# Patient Record
Sex: Female | Born: 1960 | Race: White | Hispanic: No | Marital: Married | Smoking: Never smoker
Health system: Southern US, Community
[De-identification: ages and names within clinical notes are randomized; demographics above are authoritative.]

## PROBLEM LIST (undated history)

## (undated) DIAGNOSIS — B019 Varicella without complication: Secondary | ICD-10-CM

## (undated) DIAGNOSIS — F419 Anxiety disorder, unspecified: Secondary | ICD-10-CM

## (undated) DIAGNOSIS — A6000 Herpesviral infection of urogenital system, unspecified: Secondary | ICD-10-CM

## (undated) HISTORY — PX: WISDOM TOOTH EXTRACTION: SHX21

## (undated) HISTORY — DX: Anxiety disorder, unspecified: F41.9

## (undated) HISTORY — PX: TUBAL LIGATION: SHX77

## (undated) HISTORY — DX: Varicella without complication: B01.9

## (undated) HISTORY — DX: Herpesviral infection of urogenital system, unspecified: A60.00

## (undated) HISTORY — PX: THERAPEUTIC ABORTION: SHX798

## (undated) HISTORY — PX: BACK SURGERY: SHX140

---

## 2005-02-17 ENCOUNTER — Ambulatory Visit: Payer: Self-pay

## 2006-06-15 ENCOUNTER — Other Ambulatory Visit: Payer: Self-pay

## 2006-06-15 ENCOUNTER — Observation Stay: Payer: Self-pay | Admitting: Internal Medicine

## 2006-08-03 ENCOUNTER — Ambulatory Visit: Payer: Self-pay

## 2007-11-18 ENCOUNTER — Ambulatory Visit: Payer: Self-pay | Admitting: Family Medicine

## 2008-11-20 ENCOUNTER — Ambulatory Visit: Payer: Self-pay | Admitting: Family Medicine

## 2009-12-03 ENCOUNTER — Ambulatory Visit: Payer: Self-pay

## 2011-11-10 LAB — HM COLONOSCOPY: HM COLON: NORMAL

## 2012-04-04 ENCOUNTER — Ambulatory Visit: Payer: Self-pay | Admitting: Family Medicine

## 2012-05-13 ENCOUNTER — Ambulatory Visit: Payer: Self-pay | Admitting: Unknown Physician Specialty

## 2013-06-27 ENCOUNTER — Ambulatory Visit: Payer: Self-pay | Admitting: Family Medicine

## 2013-07-04 LAB — HM PAP SMEAR: HM PAP: NORMAL

## 2013-10-25 LAB — HM MAMMOGRAPHY: HM Mammogram: NORMAL

## 2014-01-04 IMAGING — MG MM CAD SCREENING MAMMO
1 series · 5 of 5 positions shown · non-contrast
Comparison: none

REASON FOR EXAM: scr mammo no order
COMMENTS:

[R CC · right · 5 of 5 slices shown]
[im 1/5]
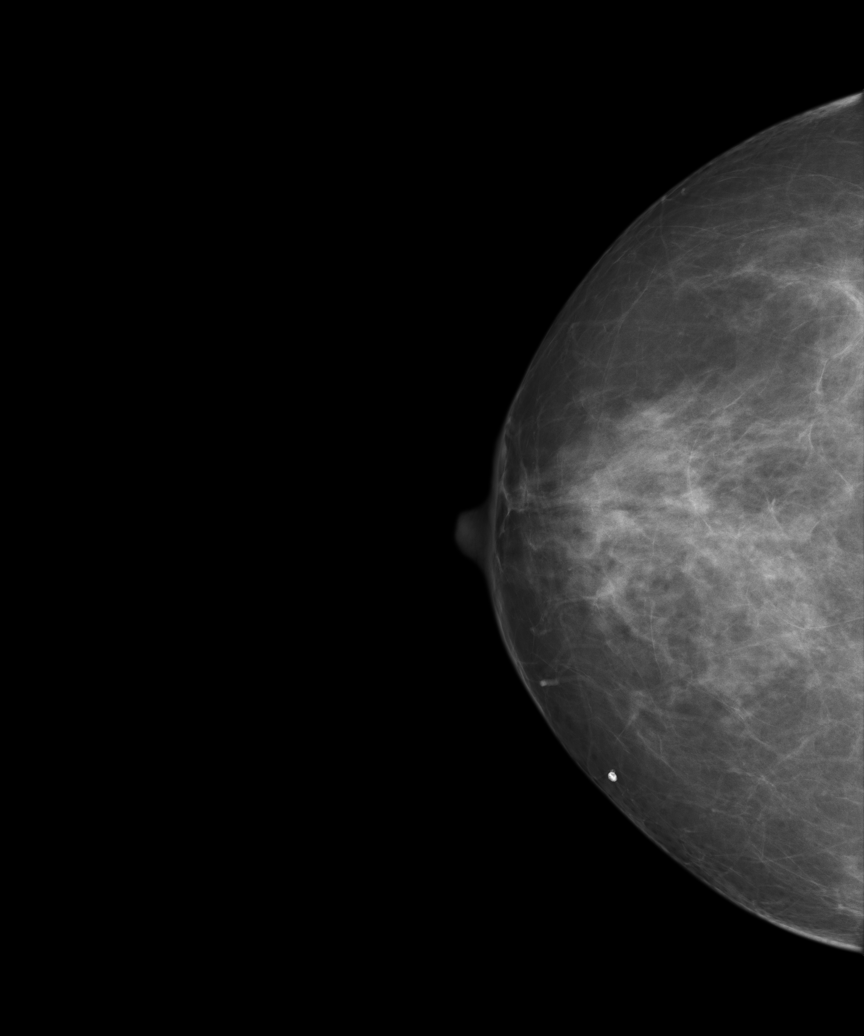
[im 2/5]
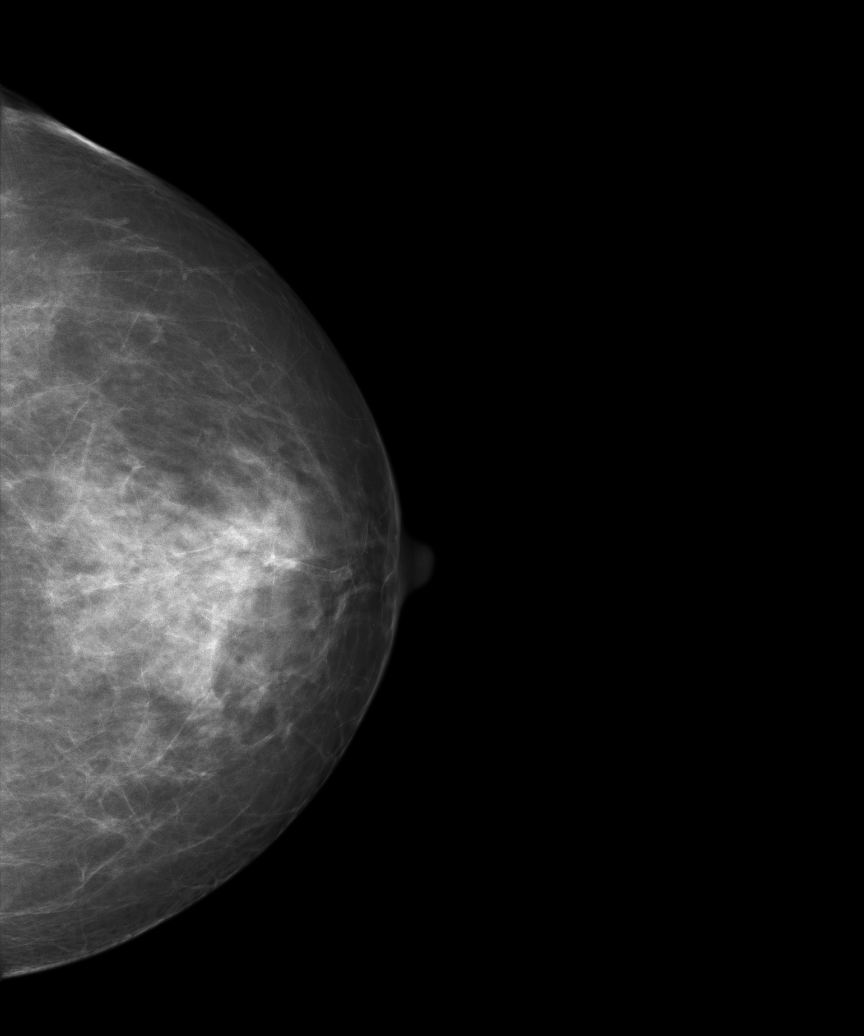
[im 3/5]
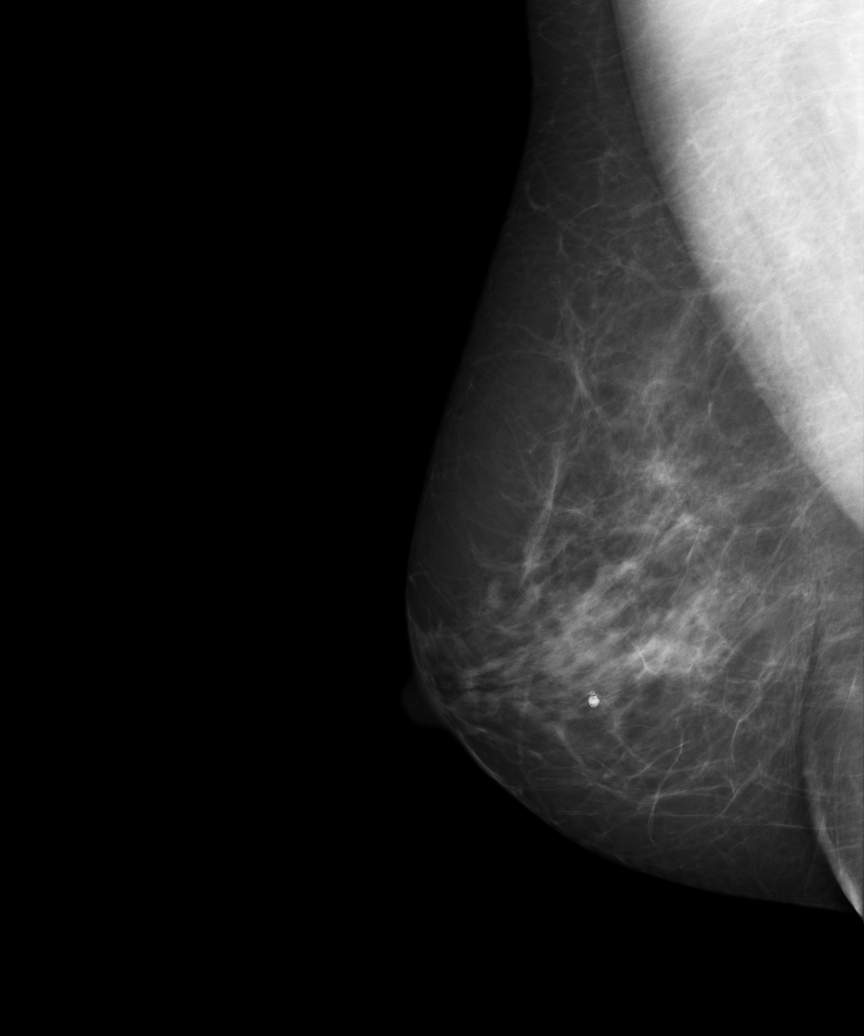
[im 4/5]
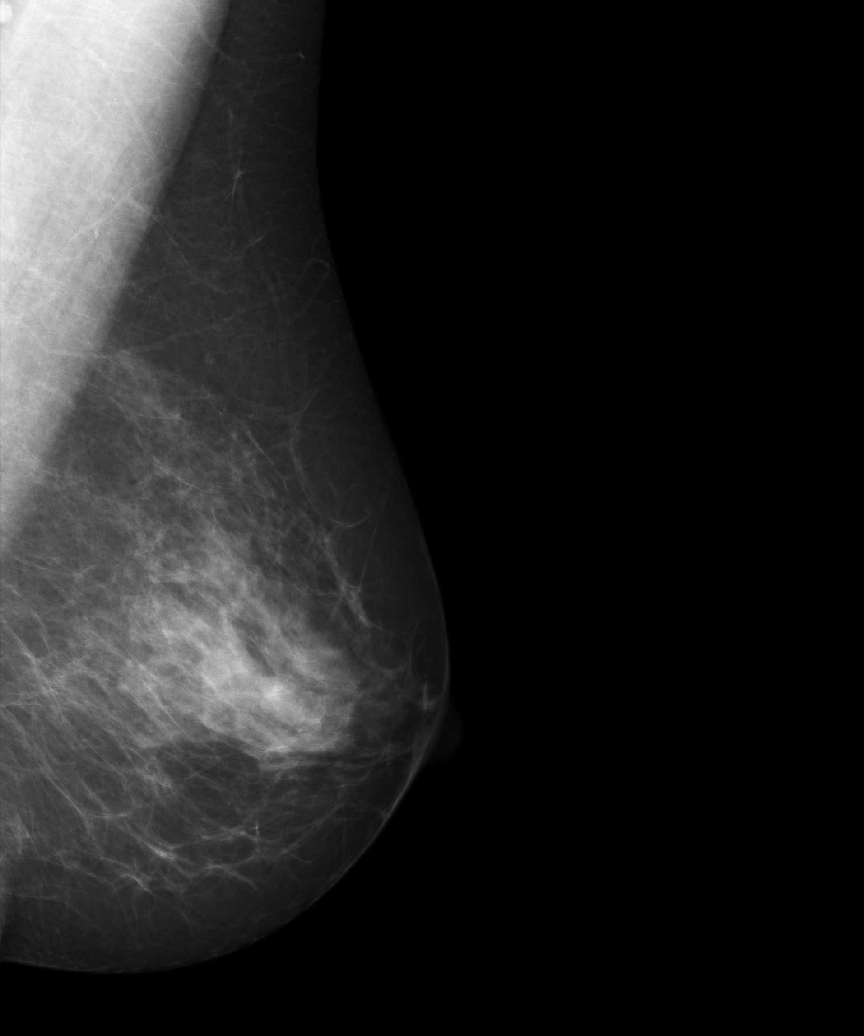
[im 5/5]
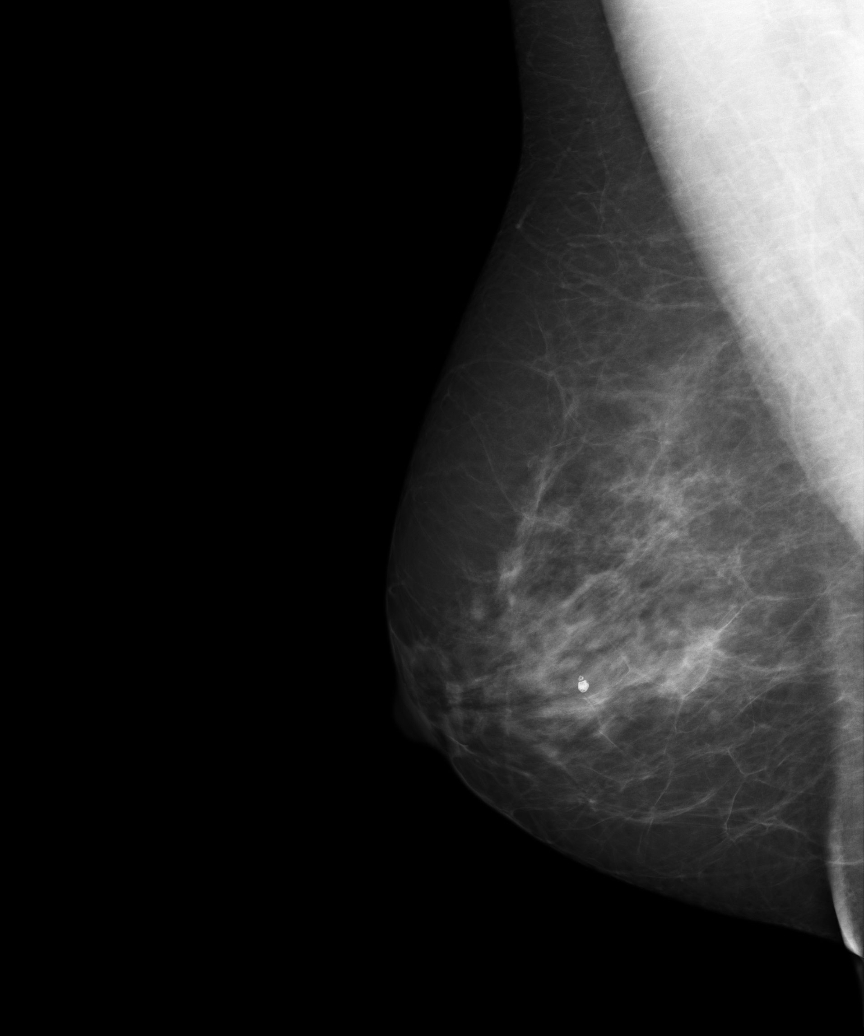

[5 of 5 positions shown; findings below may reference images not displayed]

PROCEDURE:     MAM - MAM DGTL SCRN MAM NO ORDER W/CAD  - April 04, 2012  [DATE]

RESULT:     Comparison is made to previous digital studies December 03, 2009,November 20, 2008, and June 27, 2002.

The breasts exhibit a heterogeneously dense parenchymal pattern. There is
evidence of some ongoing involution. A coarse macrocalcification is noted
medially on the right and is stable. There are no malignant appearing
groupings of microcalcification and there are no areas of new architectural
distortion.
IMPRESSION: There are no finding suspicious for malignancy.

BI-RADS 2: benign findings.

Recommendation: please continue to encourage yearly mammographic followup.

A NEGATIVE MAMMOGRAM REPORT DOES NOT PRECLUDE BIOPSY OR OTHER EVALUATION OF
A CLINICALLY PALPABLE OR OTHERWISE SUSPICIOUS MASS OR LESION. BREAST CANCER
MAY NOT BE DETECTED BY MAMMOGRAPHY IN UP TO 10% OF CASES.

Dictation site:1

## 2014-01-24 LAB — BASIC METABOLIC PANEL
BUN: 17 mg/dL (ref 4–21)
Creatinine: 0.8 mg/dL (ref 0.5–1.1)
Glucose: 93 mg/dL
Sodium: 139 mmol/L (ref 137–147)

## 2014-01-24 LAB — TSH: TSH: 1.16 u[IU]/mL (ref 0.41–5.90)

## 2014-01-24 LAB — HEPATIC FUNCTION PANEL: Bilirubin, Total: 0.7 mg/dL

## 2014-11-16 ENCOUNTER — Encounter (INDEPENDENT_AMBULATORY_CARE_PROVIDER_SITE_OTHER): Payer: Self-pay

## 2014-11-16 ENCOUNTER — Ambulatory Visit (INDEPENDENT_AMBULATORY_CARE_PROVIDER_SITE_OTHER): Payer: Self-pay | Admitting: Nurse Practitioner

## 2014-11-16 ENCOUNTER — Encounter: Payer: Self-pay | Admitting: Nurse Practitioner

## 2014-11-16 VITALS — BP 118/70 | HR 74 | Temp 97.7°F | Resp 12 | Ht 66.5 in | Wt 202.0 lb

## 2014-11-16 DIAGNOSIS — Z13220 Encounter for screening for lipoid disorders: Secondary | ICD-10-CM

## 2014-11-16 DIAGNOSIS — Z7189 Other specified counseling: Secondary | ICD-10-CM

## 2014-11-16 DIAGNOSIS — Z130 Encounter for screening for diseases of the blood and blood-forming organs and certain disorders involving the immune mechanism: Secondary | ICD-10-CM

## 2014-11-16 DIAGNOSIS — Z7689 Persons encountering health services in other specified circumstances: Secondary | ICD-10-CM

## 2014-11-16 DIAGNOSIS — F411 Generalized anxiety disorder: Secondary | ICD-10-CM

## 2014-11-16 DIAGNOSIS — Z131 Encounter for screening for diabetes mellitus: Secondary | ICD-10-CM

## 2014-11-16 DIAGNOSIS — B009 Herpesviral infection, unspecified: Secondary | ICD-10-CM

## 2014-11-16 DIAGNOSIS — Z1329 Encounter for screening for other suspected endocrine disorder: Secondary | ICD-10-CM

## 2014-11-16 DIAGNOSIS — Z1239 Encounter for other screening for malignant neoplasm of breast: Secondary | ICD-10-CM

## 2014-11-16 LAB — CBC WITH DIFFERENTIAL/PLATELET
Basophils Absolute: 0 10*3/uL (ref 0.0–0.1)
Basophils Relative: 0.7 % (ref 0.0–3.0)
Eosinophils Absolute: 0.1 10*3/uL (ref 0.0–0.7)
Eosinophils Relative: 2.7 % (ref 0.0–5.0)
HEMATOCRIT: 40.7 % (ref 36.0–46.0)
Hemoglobin: 13.9 g/dL (ref 12.0–15.0)
LYMPHS ABS: 2.2 10*3/uL (ref 0.7–4.0)
Lymphocytes Relative: 42.3 % (ref 12.0–46.0)
MCHC: 34.1 g/dL (ref 30.0–36.0)
MCV: 86.7 fl (ref 78.0–100.0)
MONOS PCT: 7.9 % (ref 3.0–12.0)
Monocytes Absolute: 0.4 10*3/uL (ref 0.1–1.0)
NEUTROS ABS: 2.4 10*3/uL (ref 1.4–7.7)
Neutrophils Relative %: 46.4 % (ref 43.0–77.0)
Platelets: 239 10*3/uL (ref 150.0–400.0)
RBC: 4.7 Mil/uL (ref 3.87–5.11)
RDW: 13.7 % (ref 11.5–15.5)
WBC: 5.2 10*3/uL (ref 4.0–10.5)

## 2014-11-16 LAB — COMPREHENSIVE METABOLIC PANEL
ALBUMIN: 4.2 g/dL (ref 3.5–5.2)
ALT: 19 U/L (ref 0–35)
AST: 22 U/L (ref 0–37)
Alkaline Phosphatase: 94 U/L (ref 39–117)
BUN: 17 mg/dL (ref 6–23)
CHLORIDE: 102 meq/L (ref 96–112)
CO2: 29 mEq/L (ref 19–32)
Calcium: 9.5 mg/dL (ref 8.4–10.5)
Creatinine, Ser: 0.78 mg/dL (ref 0.40–1.20)
GFR: 81.91 mL/min (ref >60.00)
GLUCOSE: 90 mg/dL (ref 70–99)
Potassium: 3.8 mEq/L (ref 3.5–5.1)
Sodium: 136 mEq/L (ref 135–145)
TOTAL PROTEIN: 7 g/dL (ref 6.0–8.3)
Total Bilirubin: 1 mg/dL (ref 0.2–1.2)

## 2014-11-16 LAB — LIPID PANEL
CHOL/HDL RATIO: 4
Cholesterol: 219 mg/dL — ABNORMAL HIGH (ref 0–200)
HDL: 62 mg/dL (ref >39.00)
LDL Cholesterol: 144 mg/dL — ABNORMAL HIGH (ref 0–99)
NonHDL: 157
Triglycerides: 66 mg/dL (ref 0.0–149.0)
VLDL: 13.2 mg/dL (ref 0.0–40.0)

## 2014-11-16 LAB — HEMOGLOBIN A1C: Hgb A1c MFr Bld: 5.5 % (ref 4.6–6.5)

## 2014-11-16 LAB — TSH: TSH: 0.88 u[IU]/mL (ref 0.35–4.50)

## 2014-11-16 MED ORDER — VALACYCLOVIR HCL 500 MG PO TABS: 500.0000 mg | ORAL_TABLET | Freq: Two times a day (BID) | ORAL | Status: DC

## 2014-11-16 MED ORDER — ESCITALOPRAM OXALATE 20 MG PO TABS: 20.0000 mg | ORAL_TABLET | Freq: Every day | ORAL | Status: DC

## 2014-11-16 MED ORDER — ALPRAZOLAM 0.5 MG PO TABS: 0.5000 mg | ORAL_TABLET | Freq: Every evening | ORAL | Status: DC | PRN

## 2014-11-16 NOTE — Progress Notes (Signed)
Pre visit review using our clinic review tool, if applicable. No additional management support is needed unless otherwise documented below in the visit note. 

## 2014-11-16 NOTE — Patient Instructions (Addendum)
Lexapro 20 mg daily was sent to the pharmacy.   Follow up in 6 weeks to see how it is working.   Mammogram- [facility] be [facility] and schedule in a week or so.   Please visit the lab before leaving today.  Welcome to [facility]!

## 2014-11-16 NOTE — Assessment & Plan Note (Addendum)
Discussed acute and chronic issues. Reviewed health maintenance measures, PFSHx, and immunizations. Obtain routine labs TSH, Lipid panel, CBC w/ diff, A1c, and CMET.   Mammogram referral placed.

## 2014-11-16 NOTE — Assessment & Plan Note (Signed)
Valtrex refilled e-Rx. 1-2 outbreaks a year. Will follow.

## 2014-11-16 NOTE — Assessment & Plan Note (Signed)
Uncontrolled. Switching from Paxil to Lexapro. Xanax script with 2 refills given. CSC and UDS done today. FU in 6 weeks.

## 2014-11-16 NOTE — Progress Notes (Signed)
Subjective:    Patient ID: Ashley Walton, female    DOB: [birth year], 54 y.o.   MRN: [id number]  HPI  Ashley Walton is a 54 yo female establishing care today.   1) New pt info:   Diet- Eats at home mostly   Exercise- Walks 7 days a week for 20 min.   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   Labs- In need of basic screening labs she reports  Review of Systems  Constitutional: Negative for fever, chills, diaphoresis and fatigue.  HENT: Negative for tinnitus and trouble swallowing.   Eyes: Negative for visual disturbance.  Respiratory: Negative for chest tightness, shortness of breath and wheezing.   Cardiovascular: Negative for chest pain, palpitations and leg swelling.  Gastrointestinal: Negative for nausea, vomiting, diarrhea and constipation.  Genitourinary: Negative for difficulty urinating.  Musculoskeletal: Positive for back pain. Negative for neck pain.  Skin: Negative for rash.  Neurological: Negative for dizziness, weakness, numbness and headaches.  Hematological: Does not bruise/bleed easily.  Psychiatric/Behavioral: Negative for suicidal ideas and sleep disturbance. The patient is nervous/anxious.    Past Medical History  Diagnosis Date  . Chicken pox   . Genital herpes   . UTI (lower urinary tract infection)     History   Social History  . Marital Status: Married    Spouse Name: N/A  . Number of Children: N/A  . Years of Education: N/A   Occupational History  . Not on file.   Social History Main  Topics  . Smoking status: Never Smoker   . Smokeless tobacco: Not on file  . Alcohol Use: 0.6 oz/week    1 Standard drinks or equivalent per week     Comment: Social drinker  . Drug Use: No  . Sexual Activity: No   Other Topics Concern  . 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 Back surgery    . Tubal ligation      Family History  Problem Relation Age of Onset  . Hypertension Mother   . Hypertension Sister   . Heart disease Maternal Grandmother   . Heart disease Maternal Grandfather   . Heart disease Paternal Grandfather     Allergies  Allergen Reactions  . Penicillins Hives    No current outpatient prescriptions on file prior to visit.   No current facility-administered medications on file prior to visit.       Objective:   Physical Exam  Constitutional: She is oriented to person, place, and time. She appears well-developed and well-nourished. No distress.  BP 118/70 mmHg  Pulse 74  Temp(Src) 97.7 F (36.5 C) (Oral)  Resp 12  Wt 202 lb (91.627 kg)  SpO2 97%   HENT:  Head: Normocephalic and atraumatic.  Right Ear: External ear normal.  Left Ear: External ear normal.  Eyes: EOM are normal. Pupils are equal, round, and reactive to light. Right eye exhibits no discharge. Left eye exhibits no discharge. No scleral icterus.  Neck: Normal range of motion. Neck supple. No thyromegaly present.  Cardiovascular: Normal rate, regular rhythm and normal heart sounds.  Exam reveals no gallop and no friction rub.   No murmur heard. Pulmonary/Chest: Effort normal and breath sounds normal. No respiratory distress. She has no wheezes. She has no rales. She exhibits no  tenderness.  Lymphadenopathy:    She has no cervical adenopathy.  Neurological: She is alert and oriented to person, place, and time. No cranial nerve deficit. She exhibits normal muscle tone. Coordination normal.  Skin: Skin is warm and dry. No rash noted. She is not diaphoretic.  Psychiatric: She has a normal mood and affect. Her behavior is normal. Judgment and thought content normal.      Assessment & Plan:

## 2014-12-28 ENCOUNTER — Telehealth: Payer: Self-pay

## 2014-12-28 ENCOUNTER — Ambulatory Visit: Payer: Self-pay | Admitting: Nurse Practitioner

## 2014-12-28 DIAGNOSIS — Z0289 Encounter for other administrative examinations: Secondary | ICD-10-CM

## 2014-12-28 NOTE — Telephone Encounter (Signed)
Patient to no show for 8am appointment. Called and left message to make another follow up appointment as soon as possible.

## 2014-12-31 NOTE — Telephone Encounter (Signed)
Created in error. See other note.

## 2015-01-20 ENCOUNTER — Other Ambulatory Visit: Payer: Self-pay | Admitting: Nurse Practitioner

## 2017-08-06 ENCOUNTER — Encounter: Payer: Self-pay | Admitting: Internal Medicine

## 2017-08-06 ENCOUNTER — Ambulatory Visit: Payer: BLUE CROSS/BLUE SHIELD | Admitting: Internal Medicine

## 2017-08-06 ENCOUNTER — Encounter (INDEPENDENT_AMBULATORY_CARE_PROVIDER_SITE_OTHER): Payer: Self-pay

## 2017-08-06 VITALS — BP 124/70 | HR 88 | Temp 97.8°F | Ht 65.5 in | Wt 172.5 lb

## 2017-08-06 DIAGNOSIS — B009 Herpesviral infection, unspecified: Secondary | ICD-10-CM | POA: Diagnosis not present

## 2017-08-06 DIAGNOSIS — F411 Generalized anxiety disorder: Secondary | ICD-10-CM

## 2017-08-06 DIAGNOSIS — R300 Dysuria: Secondary | ICD-10-CM | POA: Diagnosis not present

## 2017-08-06 DIAGNOSIS — R350 Frequency of micturition: Secondary | ICD-10-CM

## 2017-08-06 LAB — POC URINALSYSI DIPSTICK (AUTOMATED)
Bilirubin, UA: NEGATIVE
Blood, UA: POSITIVE
Glucose, UA: NEGATIVE
KETONES UA: NEGATIVE
LEUKOCYTES UA: NEGATIVE
Nitrite, UA: NEGATIVE
PH UA: 6 (ref 5.0–8.0)
PROTEIN UA: NEGATIVE
Spec Grav, UA: 1.01 (ref 1.010–1.025)
UROBILINOGEN UA: 0.2 U/dL

## 2017-08-06 MED ORDER — VALACYCLOVIR HCL 500 MG PO TABS: 500.0000 mg | ORAL_TABLET | Freq: Two times a day (BID) | ORAL | 1 refills | Status: AC

## 2017-08-06 MED ORDER — CIPROFLOXACIN HCL 500 MG PO TABS
500.0000 mg | ORAL_TABLET | Freq: Two times a day (BID) | ORAL | 0 refills | Status: DC
Start: 2017-08-06 — End: 2017-09-03

## 2017-08-06 NOTE — Patient Instructions (Signed)

## 2017-08-06 NOTE — Assessment & Plan Note (Signed)
Currently not an issue Will monitor 

## 2017-08-06 NOTE — Assessment & Plan Note (Signed)
Only 2 outbreaks per year Valtrex refilled today

## 2017-08-06 NOTE — Progress Notes (Signed)
HPI  Pt presents to the clinic today to establish care and for management of the conditions listed below. She is transferring care from Naomie Dean, NP.  Anxiety: Triggered by work stress but improved after she changed jobs. She was on Lexapro and Xanax at one time, but has been off for several years.  Genital Herpes: She takes Valtrex as needed. She does need a refill today.  She also c/o urinary frequency and dysuria. She reports this started about 3 weeks ago. She denies fever, chills, nausea or low back pain. She denies vaginal complaints. [facility] the week of [facility]. She was treated with 5 days of Macrobid but she reports the culture did not grow any bacteria. She has not had any improvement since that time. She saw a urologist about 8-9 years ago.   History of Lumbar Fusion: She c/o intermittent low back pain but nothing significant. She goes to the chiropractor monthly.   Flu: 06/2017 Tetanus: 07/2012 Pap Smear: 06/2013 Mammogram: 10/2013 Colon Screening: 10/2011 Vision Screening: annually Dentist: biannually  Past Medical History:  Diagnosis Date  . Anxiety   . Chicken pox   . Genital herpes   . Hyperlipidemia   . UTI (lower urinary tract infection)   . UTI (urinary tract infection)     Current Outpatient Medications  Medication Sig Dispense Refill  . valACYclovir (VALTREX) 500 MG tablet Take 1 tablet (500 mg total) by mouth 2 (two) times daily. 20 tablet 1   No current facility-administered medications for this visit.     Allergies  Allergen Reactions  . Penicillins Hives    Family History  Problem Relation Age of Onset  . Hypertension Mother   . Hypertension Father   . Hypertension Sister   . Heart disease Maternal Grandmother   . Heart disease Maternal Grandfather   . Heart disease Paternal Grandfather     Social History   Socioeconomic History  . Marital status: Married    Spouse name: Not on file  . Number of children: Not on  file  . Years of education: Not on file  . Highest education level: Not on file  Social Needs  . Financial resource strain: Not on file  . Food insecurity - worry: Not on file  . Food insecurity - inability: Not on file  . Transportation needs - medical: Not on file  . Transportation needs - non-medical: Not on file  Occupational History  . Not on file  Tobacco Use  . Smoking status: Never Smoker  . Smokeless tobacco: Never Used  Substance and Sexual Activity  . Alcohol use: Yes    Alcohol/week: 0.6 oz    Types: 1 Standard drinks or equivalent per week    Comment: Social drinker  . Drug use: No  . Sexual activity: Yes  Other Topics Concern  . 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  HEENT: Denies eye pain, eye redness, ear pain, ringing in the ears, wax buildup, runny nose, nasal congestion, bloody nose, or sore throat. Respiratory: Denies difficulty breathing, shortness of  breath, cough or sputum production.   Cardiovascular: Denies chest pain, chest tightness, palpitations or swelling in the hands or feet.  Gastrointestinal: Denies abdominal pain, bloating, constipation, diarrhea or blood in the stool.  GU: Pt reports urinary frequency and dysuria. Denies  urgency, blood in urine, odor or discharge. Musculoskeletal: Pt reports intermittent low back pain. Denies decrease in range of motion, difficulty with gait, muscle pain or joint swelling.  Skin: Denies redness, rashes, lesions or ulcercations.  Neurological: Denies dizziness, difficulty with memory, difficulty with speech or problems with balance and coordination.  Psych: Denies anxiety,  depression, SI/HI.  No other specific complaints in a complete review of systems (except as listed in HPI above).  PE:  BP 124/70   Pulse 88   Temp 97.8 F (36.6 C) (Oral)   Ht 5' 5.5" (1.664 m)   Wt 172 lb 8 oz (78.2 kg)   SpO2 98%   BMI 28.27 kg/m   Wt Readings from Last 3 Encounters:  11/16/14 202 lb (91.6 kg)    General: Appears her stated age, well developed, well nourished in NAD. Cardiovascular: Normal rate and rhythm. S1,S2 noted.  No murmur, rubs or gallops noted. Pulmonary/Chest: Normal effort and positive vesicular breath sounds. No respiratory distress. No wheezes, rales or ronchi noted.  Abdomen: Soft and nontender. Normal bowel sounds. No CVA tenderness noted. Musculoskeletal:  No difficulty with gait.  Neurological: Alert and oriented.  Psychiatric: Mood and affect normal. Behavior is normal. Judgment and thought content normal.    BMET    Component Value Date/Time   NA 136 11/16/2014 0915   NA 139 01/24/2014   K 3.8 11/16/2014 0915   CL 102 11/16/2014 0915   CO2 29 11/16/2014 0915   GLUCOSE 90 11/16/2014 0915   BUN 17 11/16/2014 0915   BUN 17 01/24/2014   CREATININE 0.78 11/16/2014 0915   CALCIUM 9.5 11/16/2014 0915    Lipid Panel     Component Value Date/Time   CHOL 219 (H) 11/16/2014 0915   TRIG 66.0 11/16/2014 0915   HDL 62.00 11/16/2014 0915   CHOLHDL 4 11/16/2014 0915   VLDL 13.2 11/16/2014 0915   LDLCALC 144 (H) 11/16/2014 0915    CBC    Component Value Date/Time   WBC 5.2 11/16/2014 0915   RBC 4.70 11/16/2014 0915   HGB 13.9 11/16/2014 0915   HCT 40.7 11/16/2014 0915   PLT 239.0 11/16/2014 0915   MCV 86.7 11/16/2014 0915   MCHC 34.1 11/16/2014 0915   RDW 13.7 11/16/2014 0915   LYMPHSABS 2.2 11/16/2014 0915   MONOABS 0.4 11/16/2014 0915   EOSABS 0.1 11/16/2014 0915   BASOSABS 0.0 11/16/2014 0915    Hgb A1C Lab Results  Component Value Date   HGBA1C 5.5 11/16/2014     Assessment and Plan:  Urinary Frequency and  Dysuria:  Urinalysis: trace blood Will send urine culture Push fluids Can take AZO OTC eRx for Cipro 500 mg BID x 3 days  Make an appt for your annual exam Nicki ReaperBAITY, REGINA, NP

## 2017-08-09 LAB — URINE CULTURE
MICRO NUMBER: 90046815
SPECIMEN QUALITY: ADEQUATE

## 2017-09-03 ENCOUNTER — Other Ambulatory Visit (HOSPITAL_COMMUNITY)
Admission: RE | Admit: 2017-09-03 | Discharge: 2017-09-03 | Disposition: A | Discharge status: Home / Self Care | Payer: BLUE CROSS/BLUE SHIELD | Source: Ambulatory Visit | Attending: Internal Medicine | Admitting: Internal Medicine

## 2017-09-03 ENCOUNTER — Encounter: Payer: Self-pay | Admitting: Internal Medicine

## 2017-09-03 ENCOUNTER — Ambulatory Visit (INDEPENDENT_AMBULATORY_CARE_PROVIDER_SITE_OTHER): Payer: BLUE CROSS/BLUE SHIELD | Admitting: Internal Medicine

## 2017-09-03 VITALS — BP 124/80 | HR 68 | Temp 97.8°F | Ht 65.75 in | Wt 170.0 lb

## 2017-09-03 DIAGNOSIS — Z0000 Encounter for general adult medical examination without abnormal findings: Secondary | ICD-10-CM

## 2017-09-03 DIAGNOSIS — Z114 Encounter for screening for human immunodeficiency virus [HIV]: Secondary | ICD-10-CM | POA: Diagnosis not present

## 2017-09-03 DIAGNOSIS — Z124 Encounter for screening for malignant neoplasm of cervix: Secondary | ICD-10-CM

## 2017-09-03 DIAGNOSIS — Z1239 Encounter for other screening for malignant neoplasm of breast: Secondary | ICD-10-CM

## 2017-09-03 DIAGNOSIS — Z1159 Encounter for screening for other viral diseases: Secondary | ICD-10-CM

## 2017-09-03 DIAGNOSIS — Z1231 Encounter for screening mammogram for malignant neoplasm of breast: Secondary | ICD-10-CM | POA: Diagnosis not present

## 2017-09-03 NOTE — Progress Notes (Signed)
Subjective:    Patient ID: Ashley Walton, female    DOB: 1961-02-07, 57 y.o.   MRN: [id number]  HPI  Pt presents to the clinic today for her annual exam.  Genital Herpes: She takes Valtrex as needed for outbreaks.  Flu: 06/2017 Tetanus: 07/2012 Pap Smear: 06/2013 Mammogrma: 10/2013 Colon Screening: 10/2011 Vision Screening: annually Dentist: biannually  Diet: She does not eat a lot of meat. She consumes fruits and veggies daily. She does not eat fried foods. She drinks mostly water. Exercise: She does [employer], 3 days per week.  Review of Systems      Past Medical History:  Diagnosis Date  . Anxiety   . Chicken pox   . Genital herpes     Current Outpatient Medications  Medication Sig Dispense Refill  . ciprofloxacin (CIPRO) 500 MG tablet Take 1 tablet (500 mg total) by mouth 2 (two) times daily. 6 tablet 0  . valACYclovir (VALTREX) 500 MG tablet Take 1 tablet (500 mg total) by mouth 2 (two) times daily. 20 tablet 1   No current facility-administered medications for this visit.     Allergies  Allergen Reactions  . Penicillins Hives    Family History  Problem Relation Age of Onset  . Hypertension Mother   . Hypertension Father   . Hypertension Sister   . Heart disease Maternal Grandmother   . Heart disease Maternal Grandfather   . Heart disease Paternal Grandfather     Social History   Socioeconomic History  . Marital status: Married    Spouse name: Not on file  . Number of children: Not on file  . Years of education: Not on file  . Highest education level: Not on file  Social Needs  . Financial resource strain: Not on file  . Food insecurity - worry: Not on file  . Food insecurity - inability: Not on file  . Transportation needs - medical: Not on file  . Transportation needs - non-medical: Not on file  Occupational History  . Not on file  Tobacco Use  . Smoking status: Never Smoker  . Smokeless tobacco: Never Used  Substance and Sexual  Activity  . Alcohol use: Yes    Alcohol/week: 0.6 oz    Types: 1 Standard drinks or equivalent per week    Comment: Social drinker  . Drug use: No  . Sexual activity: Yes  Other Topics Concern  . 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  HEENT: Denies eye pain, eye redness, ear pain, ringing in the ears, wax buildup, runny nose, nasal congestion, bloody nose, or sore throat. Respiratory: Denies difficulty breathing, shortness of breath, cough or sputum production.   Cardiovascular: Denies chest pain, chest tightness, palpitations or swelling in the hands or feet.  Gastrointestinal: Denies abdominal pain, bloating, constipation, diarrhea or blood in the stool.  GU: Denies urgency, frequency, pain with urination, burning sensation, blood in urine, odor or discharge. Musculoskeletal: Pt reports intermittent right knee pain. Denies decrease in range of motion, difficulty with gait, muscle pain or joint swelling.  Skin: Denies redness,  rashes, lesions or ulcercations.  Neurological: Denies dizziness, difficulty with memory, difficulty with speech or problems with balance and coordination.  Psych: Denies anxiety, depression, SI/HI.  No other specific complaints in a complete review of systems (except as listed in HPI above).  Objective:   Physical Exam  BP 124/80   Pulse 68   Temp 97.8 F (36.6 C) (Oral)   Ht 5' 5.75" (1.67 m)   Wt 170 lb (77.1 kg)   SpO2 98%   BMI 27.65 kg/m  Wt Readings from Last 3 Encounters:  09/03/17 170 lb (77.1 kg)  08/06/17 172 lb 8 oz (78.2 kg)  11/16/14 202 lb (91.6 kg)    General: Appears her stated age, well developed,  well nourished in NAD. Skin: Warm, dry and intact HEENT: Head: normal shape and size; Eyes: sclera white, no icterus, conjunctiva pink, PERRLA and EOMs intact; Ears: Tm's gray and intact, normal light reflex; Throat/Mouth: Teeth present, mucosa pink and moist, no exudate, lesions or ulcerations noted.  Neck:  Neck supple, trachea midline. No masses, lumps or thyromegaly present.  Cardiovascular: Normal rate and rhythm. S1,S2 noted.  No murmur, rubs or gallops noted. No JVD or BLE edema. No carotid bruits noted. Pulmonary/Chest: Normal effort and positive vesicular breath sounds. No respiratory distress. No wheezes, rales or ronchi noted.  Abdomen: Soft and nontender. Normal bowel sounds. No distention or masses noted. Liver, spleen and kidneys non palpable. Pelvic: Normal female anatomy. Cervix with some reparative changes noted. Adnexa non palpable.  Musculoskeletal: Strength 5/5 BUE/BLE. No difficulty with gait.  Neurological: Alert and oriented. Cranial nerves II-XII grossly intact. Coordination normal.  Psychiatric: Mood and affect normal. Behavior is normal. Judgment and thought content normal.     BMET    Component Value Date/Time   NA 136 11/16/2014 0915   NA 139 01/24/2014   K 3.8 11/16/2014 0915   CL 102 11/16/2014 0915   CO2 29 11/16/2014 0915   GLUCOSE 90 11/16/2014 0915   BUN 17 11/16/2014 0915   BUN 17 01/24/2014   CREATININE 0.78 11/16/2014 0915   CALCIUM 9.5 11/16/2014 0915    Lipid Panel     Component Value Date/Time   CHOL 219 (H) 11/16/2014 0915   TRIG 66.0 11/16/2014 0915   HDL 62.00 11/16/2014 0915   CHOLHDL 4 11/16/2014 0915   VLDL 13.2 11/16/2014 0915   LDLCALC 144 (H) 11/16/2014 0915    CBC    Component Value Date/Time   WBC 5.2 11/16/2014 0915   RBC 4.70 11/16/2014 0915   HGB 13.9 11/16/2014 0915   HCT 40.7 11/16/2014 0915   PLT 239.0 11/16/2014 0915   MCV 86.7 11/16/2014 0915   MCHC 34.1 11/16/2014 0915   RDW 13.7 11/16/2014 0915   LYMPHSABS  2.2 11/16/2014 0915   MONOABS 0.4 11/16/2014 0915   EOSABS 0.1 11/16/2014 0915   BASOSABS 0.0 11/16/2014 0915    Hgb A1C Lab Results  Component Value Date   HGBA1C 5.5 11/16/2014            Assessment & Plan:   Preventative Health Maintenance:  Flu and tetanus UTD Pap smear today, with STD screening Mammogram ordered, she will call [facility] to schedule Encouraged her to consume a balanced diet and exercise regimen Advised her to see an eye doctor and dentist annually Will check CBC, CMET, Lipid, Vit D, HIV and Hep C today  RTC in 1 year, sooner if needed Nicki ReaperBAITY, Nicolai Labonte, NP

## 2017-09-03 NOTE — Patient Instructions (Signed)
Health Maintenance for Postmenopausal Women Menopause is a normal process in which your reproductive ability comes to an end. This process happens gradually over a span of months to years, usually between the ages of 22 and 9. Menopause is complete when you have missed 12 consecutive menstrual periods. It is important to talk with your health care provider about some of the most common conditions that affect postmenopausal women, such as heart disease, cancer, and bone loss (osteoporosis). Adopting a healthy lifestyle and getting preventive care can help to promote your health and wellness. Those actions can also lower your chances of developing some of these common conditions. What should I know about menopause? During menopause, you may experience a number of symptoms, such as:  Moderate-to-severe hot flashes.  Night sweats.  Decrease in sex drive.  Mood swings.  Headaches.  Tiredness.  Irritability.  Memory problems.  Insomnia.  Choosing to treat or not to treat menopausal changes is an individual decision that you make with your health care provider. What should I know about hormone replacement therapy and supplements? Hormone therapy products are effective for treating symptoms that are associated with menopause, such as hot flashes and night sweats. Hormone replacement carries certain risks, especially as you become older. If you are thinking about using estrogen or estrogen with progestin treatments, discuss the benefits and risks with your health care provider. What should I know about heart disease and stroke? Heart disease, heart attack, and stroke become more likely as you age. This may be due, in part, to the hormonal changes that your body experiences during menopause. These can affect how your body processes dietary fats, triglycerides, and cholesterol. Heart attack and stroke are both medical emergencies. There are many things that you can do to help prevent heart disease  and stroke:  Have your blood pressure checked at least every 1-2 years. High blood pressure causes heart disease and increases the risk of stroke.  If you are 53-22 years old, ask your health care provider if you should take aspirin to prevent a heart attack or a stroke.  Do not use any tobacco products, including cigarettes, chewing tobacco, or electronic cigarettes. If you need help quitting, ask your health care provider.  It is important to eat a healthy diet and maintain a healthy weight. ? Be sure to include plenty of vegetables, fruits, low-fat dairy products, and lean protein. ? Avoid eating foods that are high in solid fats, added sugars, or salt (sodium).  Get regular exercise. This is one of the most important things that you can do for your health. ? Try to exercise for at least 150 minutes each week. The type of exercise that you do should increase your heart rate and make you sweat. This is known as moderate-intensity exercise. ? Try to do strengthening exercises at least twice each week. Do these in addition to the moderate-intensity exercise.  Know your numbers.Ask your health care provider to check your cholesterol and your blood glucose. Continue to have your blood tested as directed by your health care provider.  What should I know about cancer screening? There are several types of cancer. Take the following steps to reduce your risk and to catch any cancer development as early as possible. Breast Cancer  Practice breast self-awareness. ? This means understanding how your breasts normally appear and feel. ? It also means doing regular breast self-exams. Let your health care provider know about any changes, no matter how small.  If you are 40  or older, have a clinician do a breast exam (clinical breast exam or CBE) every year. Depending on your age, family history, and medical history, it may be recommended that you also have a yearly breast X-ray (mammogram).  If you  have a family history of breast cancer, talk with your health care provider about genetic screening.  If you are at high risk for breast cancer, talk with your health care provider about having an MRI and a mammogram every year.  Breast cancer (BRCA) gene test is recommended for women who have family members with BRCA-related cancers. Results of the assessment will determine the need for genetic counseling and BRCA1 and for BRCA2 testing. BRCA-related cancers include these types: ? Breast. This occurs in males or females. ? Ovarian. ? Tubal. This may also be called fallopian tube cancer. ? Cancer of the abdominal or pelvic lining (peritoneal cancer). ? Prostate. ? Pancreatic.  Cervical, Uterine, and Ovarian Cancer Your health care provider may recommend that you be screened regularly for cancer of the pelvic organs. These include your ovaries, uterus, and vagina. This screening involves a pelvic exam, which includes checking for microscopic changes to the surface of your cervix (Pap test).  For women ages 21-65, health care providers may recommend a pelvic exam and a Pap test every three years. For women ages 79-65, they may recommend the Pap test and pelvic exam, combined with testing for human papilloma virus (HPV), every five years. Some types of HPV increase your risk of cervical cancer. Testing for HPV may also be done on women of any age who have unclear Pap test results.  Other health care providers may not recommend any screening for nonpregnant women who are considered low risk for pelvic cancer and have no symptoms. Ask your health care provider if a screening pelvic exam is right for you.  If you have had past treatment for cervical cancer or a condition that could lead to cancer, you need Pap tests and screening for cancer for at least 20 years after your treatment. If Pap tests have been discontinued for you, your risk factors (such as having a new sexual partner) need to be  reassessed to determine if you should start having screenings again. Some women have medical problems that increase the chance of getting cervical cancer. In these cases, your health care provider may recommend that you have screening and Pap tests more often.  If you have a family history of uterine cancer or ovarian cancer, talk with your health care provider about genetic screening.  If you have vaginal bleeding after reaching menopause, tell your health care provider.  There are currently no reliable tests available to screen for ovarian cancer.  Lung Cancer Lung cancer screening is recommended for adults 69-62 years old who are at high risk for lung cancer because of a history of smoking. A yearly low-dose CT scan of the lungs is recommended if you:  Currently smoke.  Have a history of at least 30 pack-years of smoking and you currently smoke or have quit within the past 15 years. A pack-year is smoking an average of one pack of cigarettes per day for one year.  Yearly screening should:  Continue until it has been 15 years since you quit.  Stop if you develop a health problem that would prevent you from having lung cancer treatment.  Colorectal Cancer  This type of cancer can be detected and can often be prevented.  Routine colorectal cancer screening usually begins at  age 42 and continues through age 45.  If you have risk factors for colon cancer, your health care provider may recommend that you be screened at an earlier age.  If you have a family history of colorectal cancer, talk with your health care provider about genetic screening.  Your health care provider may also recommend using home test kits to check for hidden blood in your stool.  A small camera at the end of a tube can be used to examine your colon directly (sigmoidoscopy or colonoscopy). This is done to check for the earliest forms of colorectal cancer.  Direct examination of the colon should be repeated every  5-10 years until age 71. However, if early forms of precancerous polyps or small growths are found or if you have a family history or genetic risk for colorectal cancer, you may need to be screened more often.  Skin Cancer  Check your skin from head to toe regularly.  Monitor any moles. Be sure to tell your health care provider: ? About any new moles or changes in moles, especially if there is a change in a mole's shape or color. ? If you have a mole that is larger than the size of a pencil eraser.  If any of your family members has a history of skin cancer, especially at a young age, talk with your health care provider about genetic screening.  Always use sunscreen. Apply sunscreen liberally and repeatedly throughout the day.  Whenever you are outside, protect yourself by wearing long sleeves, pants, a wide-brimmed hat, and sunglasses.  What should I know about osteoporosis? Osteoporosis is a condition in which bone destruction happens more quickly than new bone creation. After menopause, you may be at an increased risk for osteoporosis. To help prevent osteoporosis or the bone fractures that can happen because of osteoporosis, the following is recommended:  If you are 46-71 years old, get at least 1,000 mg of calcium and at least 600 mg of vitamin D per day.  If you are older than age 55 but [place] than age 65, get at least 1,200 mg of calcium and at least 600 mg of vitamin D per day.  If you are older than age 54, get at least 1,200 mg of calcium and at least 800 mg of vitamin D per day.  Smoking and excessive alcohol intake increase the risk of osteoporosis. Eat foods that are rich in calcium and vitamin D, and do weight-bearing exercises several times each week as directed by your health care provider. What should I know about how menopause affects my mental health? Depression may occur at any age, but it is more common as you become older. Common symptoms of depression  include:  Low or sad mood.  Changes in sleep patterns.  Changes in appetite or eating patterns.  Feeling an overall lack of motivation or enjoyment of activities that you previously enjoyed.  Frequent crying spells.  Talk with your health care provider if you think that you are experiencing depression. What should I know about immunizations? It is important that you get and maintain your immunizations. These include:  Tetanus, diphtheria, and pertussis (Tdap) booster vaccine.  Influenza every year before the flu season begins.  Pneumonia vaccine.  Shingles vaccine.  Your health care provider may also recommend other immunizations. This information is not intended to replace advice given to you by your health care provider. Make sure you discuss any questions you have with your health care provider. Document Released: 09/04/2005  Document Revised: 01/31/2016 Document Reviewed: 04/16/2015 Elsevier Interactive Patient Education  2018 Elsevier Inc.  

## 2017-09-05 LAB — COMPREHENSIVE METABOLIC PANEL
AG Ratio: 2 (calc) (ref 1.0–2.5)
ALT: 19 U/L (ref 6–29)
AST: 19 U/L (ref 10–35)
Albumin: 4.5 g/dL (ref 3.6–5.1)
Alkaline phosphatase (APISO): 93 U/L (ref 33–130)
BUN: 13 mg/dL (ref 7–25)
CHLORIDE: 102 mmol/L (ref 98–110)
CO2: 28 mmol/L (ref 20–32)
CREATININE: 0.76 mg/dL (ref 0.50–1.05)
Calcium: 9.6 mg/dL (ref 8.6–10.4)
GLOBULIN: 2.2 g/dL (ref 1.9–3.7)
GLUCOSE: 94 mg/dL (ref 65–99)
Potassium: 4.2 mmol/L (ref 3.5–5.3)
SODIUM: 137 mmol/L (ref 135–146)
TOTAL PROTEIN: 6.7 g/dL (ref 6.1–8.1)
Total Bilirubin: 0.6 mg/dL (ref 0.2–1.2)

## 2017-09-05 LAB — VITAMIN D 25 HYDROXY (VIT D DEFICIENCY, FRACTURES): Vit D, 25-Hydroxy: 27 ng/mL — ABNORMAL LOW (ref 30–100)

## 2017-09-05 LAB — CBC
HCT: 40.3 % (ref 35.0–45.0)
Hemoglobin: 13.9 g/dL (ref 11.7–15.5)
MCH: 30.2 pg (ref 27.0–33.0)
MCHC: 34.5 g/dL (ref 32.0–36.0)
MCV: 87.6 fL (ref 80.0–100.0)
MPV: 10.5 fL (ref 7.5–12.5)
PLATELETS: 290 10*3/uL (ref 140–400)
RBC: 4.6 10*6/uL (ref 3.80–5.10)
RDW: 12.9 % (ref 11.0–15.0)
WBC: 6.4 10*3/uL (ref 3.8–10.8)

## 2017-09-05 LAB — LIPID PANEL
CHOL/HDL RATIO: 2.6 (calc) (ref <5.0)
Cholesterol: 189 mg/dL (ref <200)
HDL: 72 mg/dL (ref >50)
LDL Cholesterol (Calc): 94 mg/dL (calc)
NON-HDL CHOLESTEROL (CALC): 117 mg/dL (ref <130)
TRIGLYCERIDES: 125 mg/dL (ref <150)

## 2017-09-05 LAB — HIV ANTIBODY (ROUTINE TESTING W REFLEX): HIV: NONREACTIVE

## 2017-09-05 LAB — HEPATITIS C ANTIBODY
Hepatitis C Ab: NONREACTIVE
SIGNAL TO CUT-OFF: 0.01 (ref <1.00)

## 2017-09-08 LAB — CYTOLOGY - PAP
Bacterial vaginitis: NEGATIVE
Candida vaginitis: NEGATIVE
Chlamydia: NEGATIVE
Diagnosis: NEGATIVE
HPV (WINDOPATH): NOT DETECTED
NEISSERIA GONORRHEA: NEGATIVE
Trichomonas: NEGATIVE

## 2018-03-08 ENCOUNTER — Ambulatory Visit: Payer: BLUE CROSS/BLUE SHIELD | Admitting: Internal Medicine

## 2018-03-08 ENCOUNTER — Encounter: Payer: Self-pay | Admitting: Internal Medicine

## 2018-03-08 VITALS — BP 122/78 | HR 75 | Temp 98.3°F | Wt 162.0 lb

## 2018-03-08 DIAGNOSIS — Z113 Encounter for screening for infections with a predominantly sexual mode of transmission: Secondary | ICD-10-CM

## 2018-03-08 DIAGNOSIS — N898 Other specified noninflammatory disorders of vagina: Secondary | ICD-10-CM

## 2018-03-08 DIAGNOSIS — R300 Dysuria: Secondary | ICD-10-CM | POA: Diagnosis not present

## 2018-03-08 DIAGNOSIS — R3989 Other symptoms and signs involving the genitourinary system: Secondary | ICD-10-CM

## 2018-03-08 DIAGNOSIS — R1032 Left lower quadrant pain: Secondary | ICD-10-CM

## 2018-03-08 DIAGNOSIS — R350 Frequency of micturition: Secondary | ICD-10-CM

## 2018-03-08 LAB — POC URINALSYSI DIPSTICK (AUTOMATED)
BILIRUBIN UA: NEGATIVE
Color, UA: NEGATIVE
GLUCOSE UA: NEGATIVE
Ketones, UA: NEGATIVE
Nitrite, UA: POSITIVE
Protein, UA: NEGATIVE
Spec Grav, UA: 1.02 (ref 1.010–1.025)
UROBILINOGEN UA: 0.2 U/dL
pH, UA: 6 (ref 5.0–8.0)

## 2018-03-08 MED ORDER — NITROFURANTOIN MONOHYD MACRO 100 MG PO CAPS: 100.0000 mg | ORAL_CAPSULE | Freq: Two times a day (BID) | ORAL | 0 refills | Status: DC

## 2018-03-08 NOTE — Progress Notes (Signed)
HPI  Pt presents to the clinic today with c/o urinary frequency and dysuria.  She reports this started 5 days ago.  She reports associated bladder pressure, left lower quadrant pain and vaginal discharge.  She denies urgency or blood in her urine.  Her bowels are moving normally.  She reports the discharge is yellow, with no odor.  She reports chills but denies fever or body aches.  She is postmenopausal and has not noticed any vaginal pain.  She has tried to increase her fluid intake, a heating pad and cranberry juice with minimal relief of 6.  She is active with one female partner.   Review of Systems  Past Medical History:  Diagnosis Date  . Anxiety   . Chicken pox   . Genital herpes     Family History  Problem Relation Age of Onset  . Hypertension Mother   . Hypertension Father   . Hypertension Sister   . Heart disease Maternal Grandmother   . Heart disease Maternal Grandfather   . Heart disease Paternal Grandfather     Social History   Socioeconomic History  . Marital status: Married    Spouse name: Not on file  . Number of children: Not on file  . Years of education: Not on file  . Highest education level: Not on file  Occupational History  . Not on file  Social Needs  . Financial resource strain: Not on file  . Food insecurity:    Worry: Not on file    Inability: Not on file  . Transportation needs:    Medical: Not on file    Non-medical: Not on file  Tobacco Use  . Smoking status: Never Smoker  . Smokeless tobacco: Never Used  Substance and Sexual Activity  . Alcohol use: Yes    Alcohol/week: 1.0 standard drinks    Types: 1 Standard drinks or equivalent per week    Comment: Social drinker  . Drug use: No  . Sexual activity: Yes  Lifestyle  . Physical activity:    Days per week: Not on file    Minutes per session: Not on file  . Stress: Not on file  Relationships  . Social connections:    Talks on phone: Not on file    Gets together: Not on file   Attends religious service: Not on file    Active member of club or organization: Not on file    Attends meetings of clubs or organizations: Not on file    Relationship status: Not on file  . Intimate partner violence:    Fear of current or ex partner: Not on file    Emotionally abused: Not on file    Physically abused: Not on file    Forced sexual activity: Not on file  Other Topics Concern  . 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 Penicillins Hives     Constitutional: Denies fever, malaise, fatigue, headache or abrupt weight changes.   Abdomen: Pt reports LLQ abdominal pain.  Denies constipation, diarrhea, vomiting or blood in her stool. GU: Pt reports bladder pressure, frequency, pain with urination  and vaginal discharge. Denies burning sensation, blood in urine, odor or discharge. Skin: Denies redness, rashes, lesions or ulcercations.   No other specific complaints in a complete review of systems (except as listed in HPI above).    Objective:   Physical Exam  Pulse 75   Temp 98.3 F (36.8 C) (Oral)   SpO2 99%  Wt Readings from Last 3 Encounters:  09/03/17 170 lb (77.1 kg)  08/06/17 172 lb 8 oz (78.2 kg)  11/16/14 202 lb (91.6 kg)    General: Appears her stated age, well developed, well nourished in NAD. Abdomen: Soft. Normal bowel sounds. No distention or masses noted.  Tender to palpation over the bladder area and LLQ. No CVA tenderness. Pelvic: Normal female anatomy. Cervix not visualized.       Assessment & Plan:   Bladder Pressure Frequency, Dysuria,Vaginal Discharge secondary to Possible UTI:  Urinalysis: 3+ leuks, trace blood Will send urine culture eRx sent if for Macrobid 100  mg BID x 5 days Will send off wet prep, gonorrhea/chlamydia swab OK to take AZO OTC Drink plenty of fluids  RTC as needed or if symptoms persist. Nicki Reaperegina Baity, NP

## 2018-03-09 ENCOUNTER — Encounter: Payer: Self-pay | Admitting: Internal Medicine

## 2018-03-09 ENCOUNTER — Other Ambulatory Visit: Payer: Self-pay | Admitting: Internal Medicine

## 2018-03-09 MED ORDER — METRONIDAZOLE 0.75 % VA GEL: 1.0000 | Freq: Two times a day (BID) | VAGINAL | 0 refills | Status: AC

## 2018-03-09 NOTE — Patient Instructions (Signed)

## 2018-03-10 LAB — URINE CULTURE
MICRO NUMBER: 90959275
SPECIMEN QUALITY:: ADEQUATE

## 2018-03-10 LAB — C. TRACHOMATIS/N. GONORRHOEAE RNA
C. trachomatis RNA, TMA: NOT DETECTED
N. GONORRHOEAE RNA, TMA: NOT DETECTED

## 2018-03-10 LAB — WET PREP BY MOLECULAR PROBE
CANDIDA SPECIES: NOT DETECTED
MICRO NUMBER:: 90959276
SPECIMEN QUALITY: ADEQUATE
TRICHOMONAS VAG: NOT DETECTED

## 2018-03-18 ENCOUNTER — Telehealth: Payer: Self-pay | Admitting: Internal Medicine

## 2018-03-18 NOTE — Telephone Encounter (Signed)
Pt last seen 03/08/18; I looked at that visit and did not see pt being started on another abx.Please advise.

## 2018-03-18 NOTE — Telephone Encounter (Signed)
She does not need additional abx for UTI. How are her symptoms? She was sent in Metrogel for BV. Did she pick that up?

## 2018-03-18 NOTE — Telephone Encounter (Signed)
Copied from CRM [id number]. Topic: Quick Communication - See Telephone Encounter >> Mar 18, 2018  2:03 PM Burchel, Abbi R wrote: CRM for notification. See Telephone encounter for: 03/18/18.  Pt states she has finished abx for UTI, she states that Rene KocherRegina was going to start her on another rx when she was finished.  Please advise.   Pt: [phone]

## 2018-03-18 NOTE — Telephone Encounter (Signed)
Pt has completed both Rx and Sx have improved... Pt states she spoke to you about taking a low dose Ax after sexual intercourse, she is not requesting for another Ax for UTI... Please advise

## 2018-03-19 MED ORDER — NITROFURANTOIN MONOHYD MACRO 100 MG PO CAPS: 100.0000 mg | ORAL_CAPSULE | Freq: Every day | ORAL | 0 refills | Status: AC | PRN

## 2018-03-19 NOTE — Telephone Encounter (Signed)
RX sent to pharmacy  

## 2018-03-19 NOTE — Addendum Note (Signed)
Addended by: Lorre MunroeBAITY, REGINA W on: 03/19/2018 10:58 AM   Modules accepted: Orders

## 2018-03-22 NOTE — Telephone Encounter (Signed)
Pt is aware as instructed 

## 2019-02-07 ENCOUNTER — Telehealth: Payer: Self-pay | Admitting: *Deleted

## 2019-02-07 DIAGNOSIS — Z20822 Contact with and (suspected) exposure to covid-19: Secondary | ICD-10-CM

## 2019-02-07 NOTE — Telephone Encounter (Signed)
Pt referred for COVID-19 testing by Charletta Cousin. Reason for testing: Exposure Insurance: [employer].   Pt called and scheduled for appt on 02/08/19 at [facility] site. Pt advised to wear a mask and remain in the car at appt time. Pt verbalized understanding.

## 2019-02-08 ENCOUNTER — Other Ambulatory Visit: Payer: BLUE CROSS/BLUE SHIELD

## 2019-02-08 DIAGNOSIS — Z20822 Contact with and (suspected) exposure to covid-19: Secondary | ICD-10-CM

## 2019-02-12 LAB — NOVEL CORONAVIRUS, NAA: SARS-CoV-2, NAA: NOT DETECTED

## 2019-03-09 ENCOUNTER — Emergency Department
Admission: EM | Admit: 2019-03-09 | Discharge: 2019-03-09 | Disposition: A | Discharge status: Home / Self Care | Payer: Self-pay | Attending: Emergency Medicine | Admitting: Emergency Medicine

## 2019-03-09 ENCOUNTER — Encounter: Payer: Self-pay | Admitting: Emergency Medicine

## 2019-03-09 ENCOUNTER — Other Ambulatory Visit: Payer: Self-pay

## 2019-03-09 DIAGNOSIS — R55 Syncope and collapse: Secondary | ICD-10-CM | POA: Insufficient documentation

## 2019-03-09 DIAGNOSIS — R42 Dizziness and giddiness: Secondary | ICD-10-CM | POA: Insufficient documentation

## 2019-03-09 DIAGNOSIS — Z79899 Other long term (current) drug therapy: Secondary | ICD-10-CM | POA: Insufficient documentation

## 2019-03-09 LAB — CBC
HCT: 34.2 % — ABNORMAL LOW (ref 36.0–46.0)
Hemoglobin: 11.3 g/dL — ABNORMAL LOW (ref 12.0–15.0)
MCH: 27.4 pg (ref 26.0–34.0)
MCHC: 33 g/dL (ref 30.0–36.0)
MCV: 82.8 fL (ref 80.0–100.0)
Platelets: 280 10*3/uL (ref 150–400)
RBC: 4.13 MIL/uL (ref 3.87–5.11)
RDW: 12.8 % (ref 11.5–15.5)
WBC: 6.8 10*3/uL (ref 4.0–10.5)
nRBC: 0 % (ref 0.0–0.2)

## 2019-03-09 LAB — BASIC METABOLIC PANEL
Anion gap: 9 (ref 5–15)
BUN: 15 mg/dL (ref 6–20)
CO2: 25 mmol/L (ref 22–32)
Calcium: 9.3 mg/dL (ref 8.9–10.3)
Chloride: 107 mmol/L (ref 98–111)
Creatinine, Ser: 0.6 mg/dL (ref 0.44–1.00)
GFR calc Af Amer: >60 mL/min (ref >60)
GFR calc non Af Amer: >60 mL/min (ref >60)
Glucose, Bld: 102 mg/dL — ABNORMAL HIGH (ref 70–99)
Potassium: 4 mmol/L (ref 3.5–5.1)
Sodium: 141 mmol/L (ref 135–145)

## 2019-03-09 MED ORDER — SODIUM CHLORIDE 0.9% FLUSH: 3.0000 mL | Freq: Once | INTRAVENOUS | Status: DC

## 2019-03-09 NOTE — ED Notes (Signed)
Pt is being discharged to home. Pt is Aox4, VSS, pt does not show any signs of distress. AVS was given and explained to the pt and she verbalized understanding of all information.    

## 2019-03-09 NOTE — Discharge Instructions (Addendum)
Your work-up was reassuring.  You should return to the ER if your symptoms are worsening.  You should follow-up with your primary care doctor in 2 days.  Your hemoglobin was slightly lower than your baseline at 11.3.  This should be rechecked.  You may need a colonoscopy.  Your right ear also had a lot of earwax in it that could cause some dizziness.

## 2019-03-09 NOTE — ED Triage Notes (Signed)
Says she passed out Tuesday night.  Says she is here because she still feels swimmy headed.  Denies headache.  Says her sugar was low-61 when they came.

## 2019-03-09 NOTE — ED Provider Notes (Signed)
[facility] Emergency Department Provider Note  ____________________________________________   First MD Initiated Contact with Patient 03/09/19 1016     (approximate)  I have reviewed the triage vital signs and the nursing notes.   HISTORY  Chief Complaint Loss of Consciousness    HPI Ashley Walton is a 58 y.o. female otherwise healthy who presents with syncope.  Patient versus having loss of consciousness 2 days ago.  Patient had had a margarita and taking a shot of tequila.  She started to feel lightheaded and flushed and then had a syncope episode where she was unconscious for 45 seconds.  There is no urinary incontinence.  No tongue biting.  No shaking activity reported.  She denies any headaches.  Patient was sitting when it happened.  She leaned over to the side but did not fall or hit her head.  She denies any shortness of breath, chest pain, abdominal pain before or after the incident.  She is not had syncope previously.  She denies any melena.  She is only on medications for vitamin deficiency.  Patient sugar was in the 60s with EMS and was better after some p.o. juice.  Denies any history of previous issues with hypoglycemia.  At that time she did not want to be transported.  Patient presents today because she still feels like her head feels a little swimmy.  Not really having any pain but feels a little off.  Denies any difficulty with ambulation.  Patient's employer told her that she should get evaluated.    Syncope occurred once, 2 days ago, unclear what brought it on, better on its own.          Past Medical History:  Diagnosis Date  . Anxiety   . Chicken pox   . Genital herpes     Patient Active Problem List   Diagnosis Date Noted  . Herpes simplex 11/16/2014    Past Surgical History:  Procedure Laterality Date  . BACK SURGERY    . CESAREAN SECTION    . THERAPEUTIC ABORTION    . TUBAL LIGATION    . WISDOM TOOTH EXTRACTION       Prior to Admission medications   Medication Sig Start Date End Date Taking? Authorizing Provider  metroNIDAZOLE (METROGEL VAGINAL) 0.75 % vaginal gel Place 1 Applicatorful vaginally 2 (two) times daily. 03/09/18   Lorre MunroeBaity, Regina W, NP  Multiple Vitamin (MULTIVITAMIN) tablet Take 1 tablet by mouth daily.    [provider]  nitrofurantoin, macrocrystal-monohydrate, (MACROBID) 100 MG capsule Take 1 capsule (100 mg total) by mouth daily as needed. After intercourse to prevent UTI 03/19/18   Lorre MunroeBaity, Regina W, NP  valACYclovir (VALTREX) 500 MG tablet Take 1 tablet (500 mg total) by mouth 2 (two) times daily. 08/06/17   Lorre MunroeBaity, Regina W, NP    Allergies Penicillins  Family History  Problem Relation Age of Onset  . Hypertension Mother   . Hypertension Father   . Hypertension Sister   . Heart disease Maternal Grandmother   . Heart disease Maternal Grandfather   . Heart disease Paternal Grandfather     Social History Social History   Tobacco Use  . Smoking status: Never Smoker  . Smokeless tobacco: Never Used  Substance Use Topics  . Alcohol use: Yes    Alcohol/week: 1.0 standard drinks    Types: 1 Standard drinks or equivalent per week    Comment: Social drinker  . Drug use: No  Review of Systems Constitutional: No fever/chills Eyes: No visual changes. ENT: No sore throat. Cardiovascular: Denies chest pain. Respiratory: Denies shortness of breath. Gastrointestinal: No abdominal pain.  No nausea, no vomiting.  No diarrhea.  No constipation. Genitourinary: Negative for dysuria. Musculoskeletal: Negative for back pain. Skin: Negative for rash. Neurological: Negative for headaches, focal weakness or numbness.  Positive for syncope and feeling like her head is swimmy All other ROS negative ____________________________________________   PHYSICAL EXAM:  VITAL SIGNS: ED Triage Vitals  Enc Vitals Group     BP 03/09/19 0947 138/87     Pulse Rate 03/09/19 0947 73      Resp 03/09/19 0947 14     Temp 03/09/19 0947 98.2 F (36.8 C)     Temp Source 03/09/19 0947 Oral     SpO2 03/09/19 0947 98 %     Weight --      Height --      Head Circumference --      Peak Flow --      Pain Score 03/09/19 0943 0     Pain Loc --      Pain Edu? --      Excl. in [place]? --     Constitutional: Alert and oriented. Well appearing and in no acute distress. Eyes: Conjunctivae are normal. EOMI. Head: Atraumatic. R TM impacted  Nose: No congestion/rhinnorhea. Mouth/Throat: Mucous membranes are moist.   Neck: No stridor. Trachea Midline. FROM Cardiovascular: Normal rate, regular rhythm. Grossly normal heart sounds.  Good peripheral circulation. Respiratory: Normal respiratory effort.  No retractions. Lungs CTAB. Gastrointestinal: Soft and nontender. No distention. No abdominal bruits.  Musculoskeletal: No lower extremity tenderness nor edema.  No joint effusions. Neurologic:  Normal speech and language.  Cranial nerves II through XII are intact.  Normal ambulation Skin:  Skin is warm, dry and intact. No rash noted. Psychiatric: Mood and affect are normal. Speech and behavior are normal. GU: Deferred   ____________________________________________   LABS (all labs ordered are listed, but only abnormal results are displayed)  Labs Reviewed  BASIC METABOLIC PANEL - Abnormal; Notable for the following components:      Result Value   Glucose, Bld 102 (*)    All other components within normal limits  CBC - Abnormal; Notable for the following components:   Hemoglobin 11.3 (*)    HCT 34.2 (*)    All other components within normal limits  URINALYSIS, COMPLETE (UACMP) WITH MICROSCOPIC   ____________________________________________   ED ECG REPORT I, Vanessa Richwood, the attending physician, personally viewed and interpreted this ECG.  EKG is normal sinus rate of 72, no ST elevation, no T wave inversion, normal intervals, no evidence of arrhythmia  ____________________________________________   INITIAL IMPRESSION / ASSESSMENT AND PLAN / ED COURSE  Ashley Walton was evaluated in Emergency Department on 03/09/2019 for the symptoms described in the history of present illness. She was evaluated in the context of the global COVID-19 pandemic, which necessitated consideration that the patient might be at risk for infection with the SARS-CoV-2 virus that causes COVID-19. Institutional protocols and algorithms that pertain to the evaluation of patients at risk for COVID-19 are in a state of rapid change based on information released by regulatory bodies including the CDC and federal and state organizations. These policies and algorithms were followed during the patient's care in the ED.     Patient is a healthy 58 year old who presents with an episode of syncope and still feeling "swimmy headed".  Patient  has normal vital signs and is well-appearing.  Will do EKG to evaluate for arrhythmia.  Denies any chest pain to suggest ACS.  No shortness of breath to suggest PE.  No abdominal pain to suggest AAA.  Patient has a normal neuro exam and did not hit her head therefore low suspicion for bleed versus stroke.  Will get basic labs to evaluate for anemia, AKI, electrolyte abnormalities, hypoglycemia.  Labs notable for hemoglobin of 11.3.  Previous hemoglobin was 13.9.  Patient denies any melena.  Offered patient rectal exam but she declined.  Instructed patient to follow-up and she may need outpatient colonoscopy patient's glucose is 102 and she says she is not eaten anything yet today.  I discussed with patient doing a CT scan of her head although I have very low suspicion and she wanted to hold off at this time.  Her right TM was impacted so I discussed treatment for this and to follow-up with her primary care doctor.  Patient says that her symptoms have been gradually improving over the last 2 days she is only feeling her symptoms minimally at this point.   Given reassuring labs and EKG patient felt comfortable without any further work-up and comfortable with discharge home.  I have low suspicion for cardiogenic cause of her syncope given her prodrome.  Unclear why she was hypoglycemic with this episode however her sugar here is normal.  Patient told to follow-up with her PCP in 2 days and to return to the ER if her symptoms are changing or worsening in any way.       ____________________________________________   FINAL CLINICAL IMPRESSION(S) / ED DIAGNOSES   Final diagnoses:  Syncope, unspecified syncope type      MEDICATIONS GIVEN DURING THIS VISIT:  Medications  sodium chloride flush (NS) 0.9 % injection 3 mL (3 mLs Intravenous Not Given 03/09/19 1030)     ED Discharge Orders    None       Note:  This document was prepared using Dragon voice recognition software and may include unintentional dictation errors.   Concha SeFunke, Kristie Bracewell E, MD 03/09/19 [phone]

## 2019-03-13 ENCOUNTER — Other Ambulatory Visit: Payer: Self-pay | Admitting: Family Medicine

## 2019-03-13 DIAGNOSIS — Z20822 Contact with and (suspected) exposure to covid-19: Secondary | ICD-10-CM

## 2019-07-26 ENCOUNTER — Other Ambulatory Visit: Payer: Self-pay | Admitting: Internal Medicine

## 2020-05-21 ENCOUNTER — Other Ambulatory Visit: Payer: Self-pay | Admitting: Gerontology

## 2020-05-21 DIAGNOSIS — Z1231 Encounter for screening mammogram for malignant neoplasm of breast: Secondary | ICD-10-CM

## 2021-05-22 ENCOUNTER — Other Ambulatory Visit: Payer: Self-pay | Admitting: Gerontology

## 2021-05-22 DIAGNOSIS — Z1231 Encounter for screening mammogram for malignant neoplasm of breast: Secondary | ICD-10-CM
